# Patient Record
Sex: Female | Born: 1985 | Race: Black or African American | Hispanic: No | Marital: Single | Smoking: Never smoker
Health system: Southern US, Community
[De-identification: ages and names within clinical notes are randomized; demographics above are authoritative.]

## PROBLEM LIST (undated history)

## (undated) HISTORY — PX: CHOLECYSTECTOMY: SHX55

---
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
## 2007-05-24 IMAGING — US US OB COMP +14 WK
1 series · 14 of 21 positions shown · non-contrast
Comparison: none

CLINICAL DATA: Abdominal pain and fever.

[Series 1: us ob comp +14 wk · 0.35mm/px · 14 of 21 slices shown]
[im 1/21]
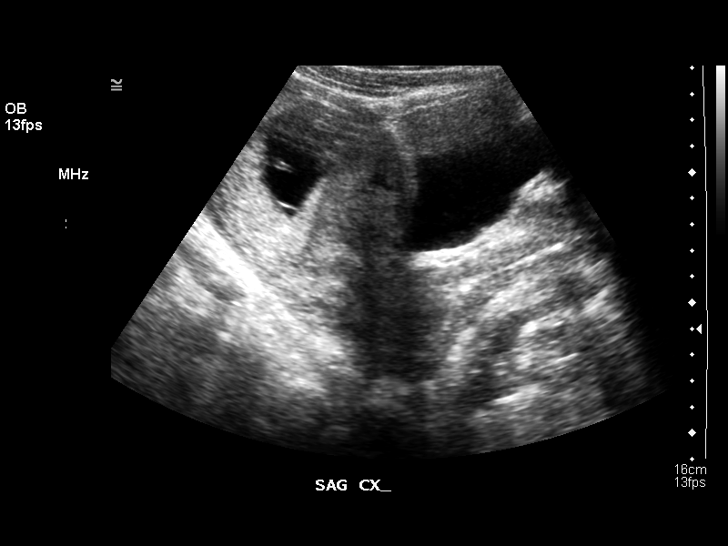
[im 3/21]
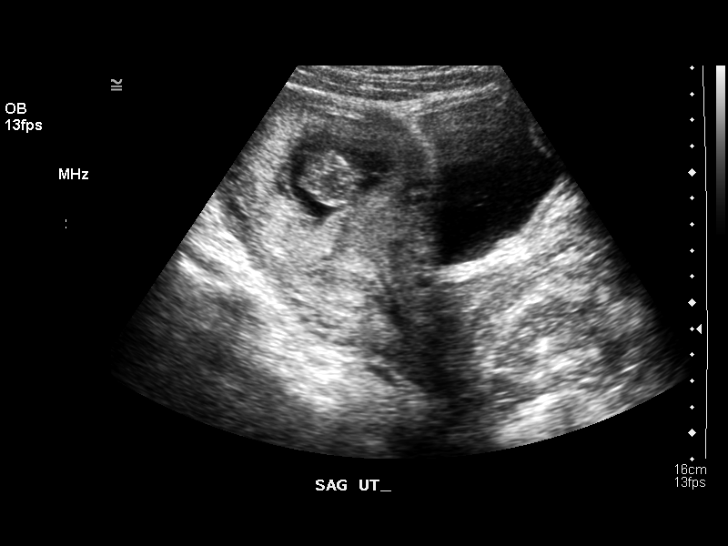
[im 4/21]
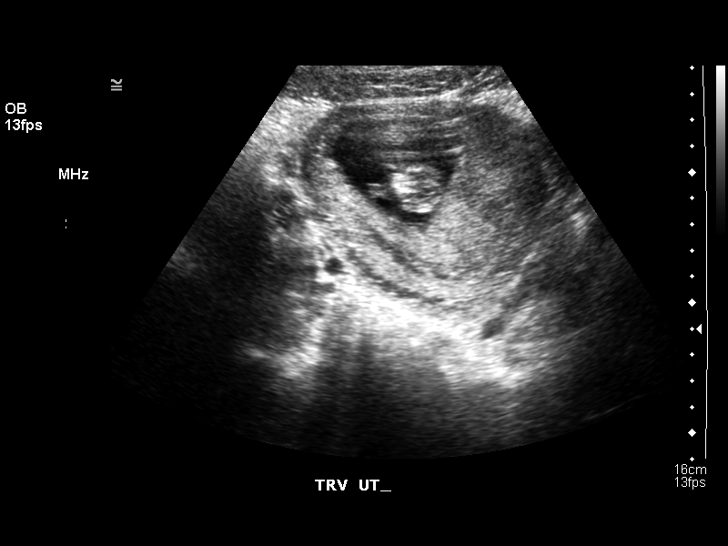
[im 6/21]
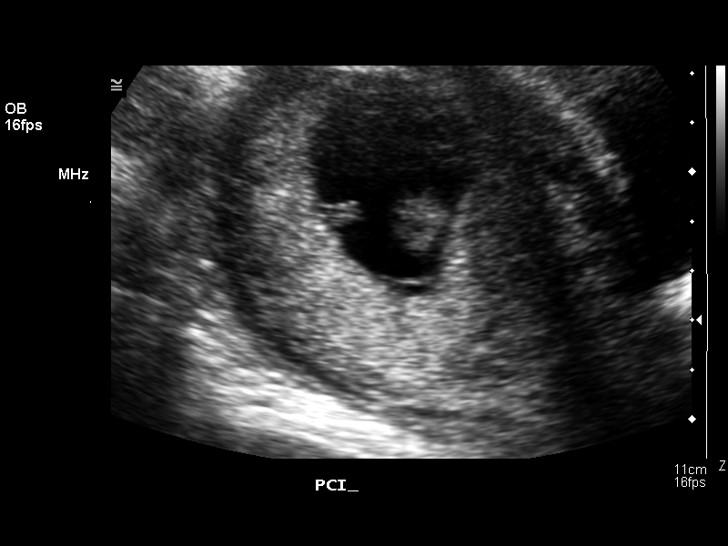
[im 7/21]
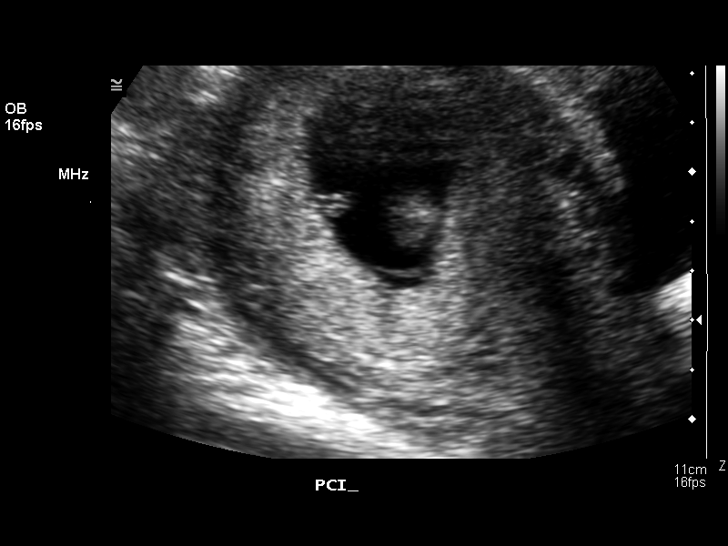
[im 9/21]
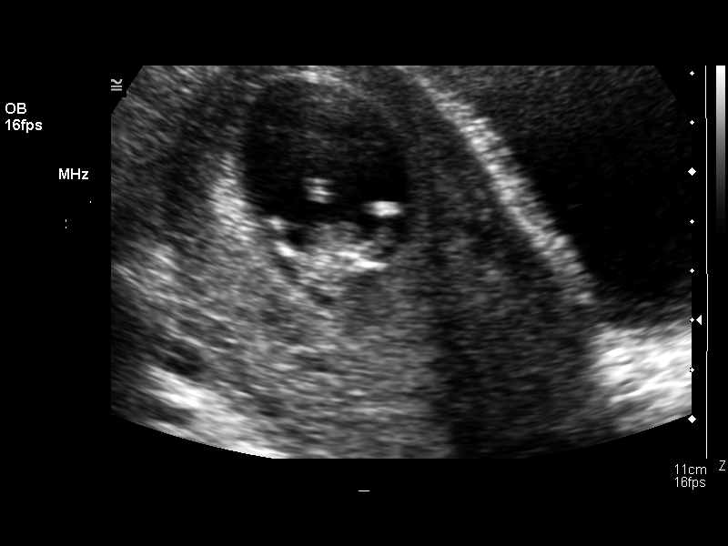
[im 10/21]
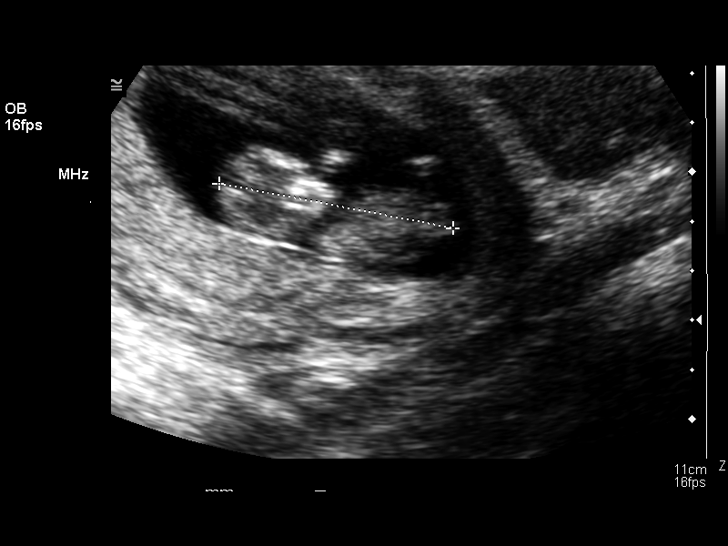
[im 12/21]
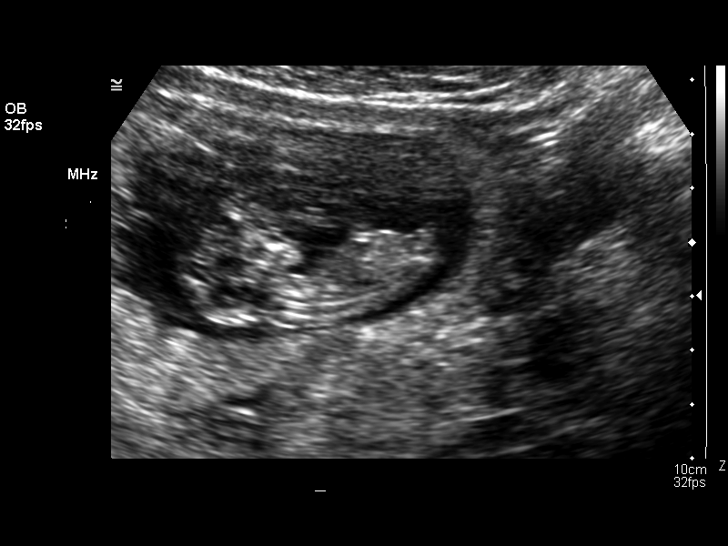
[im 13/21]
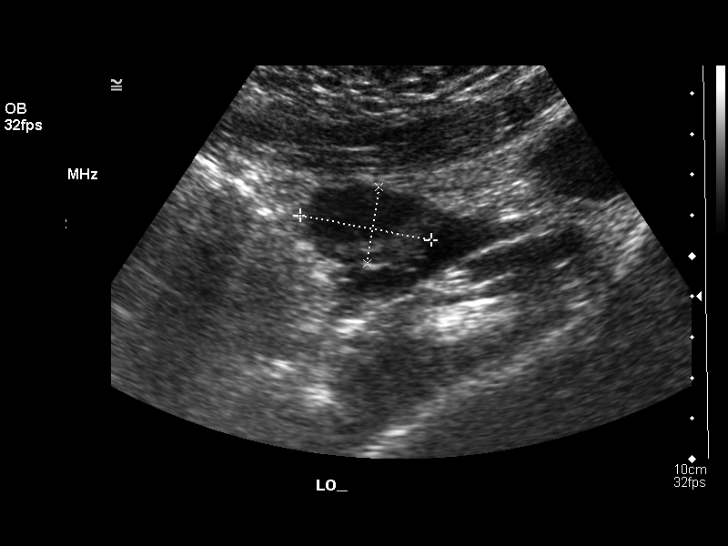
[im 15/21]
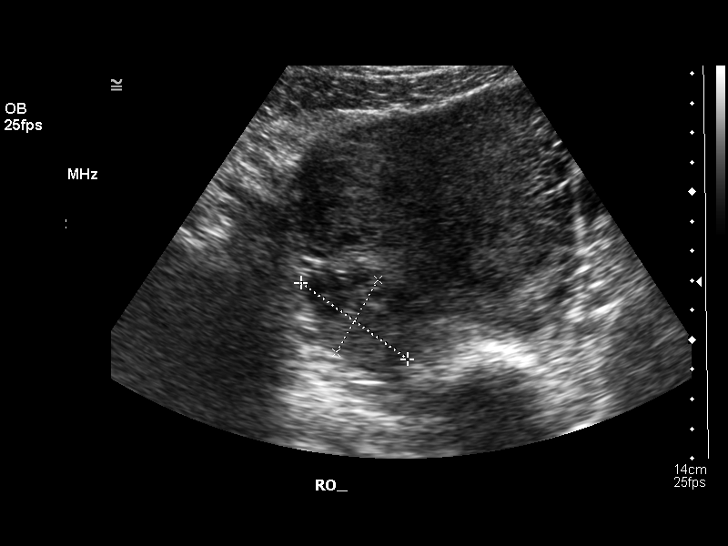
[im 16/21]
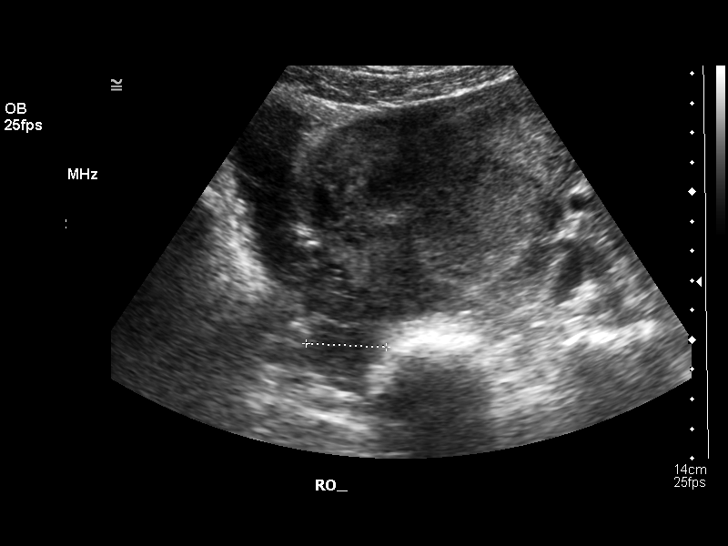
[im 18/21]
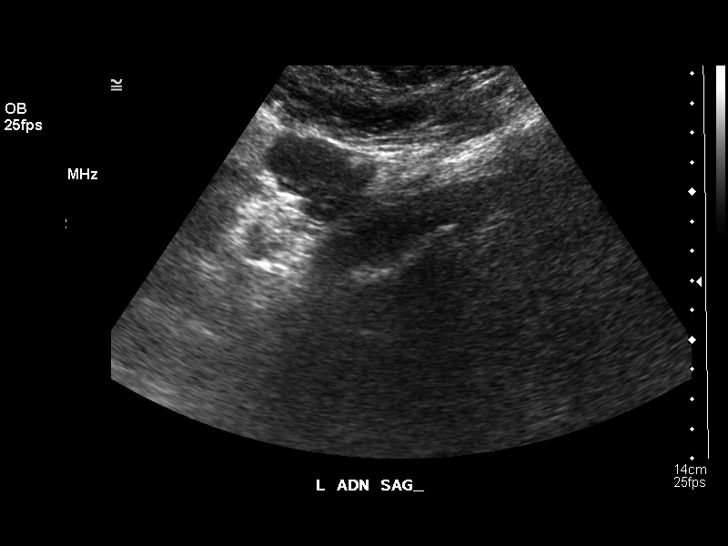
[im 19/21]
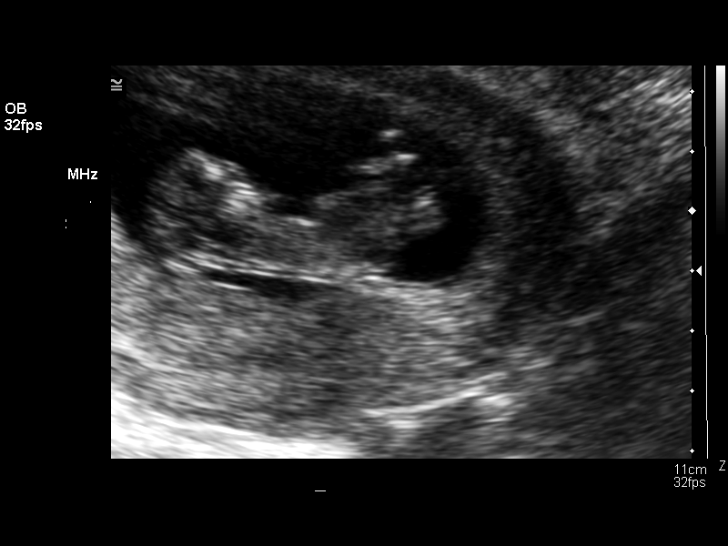
[im 21/21]
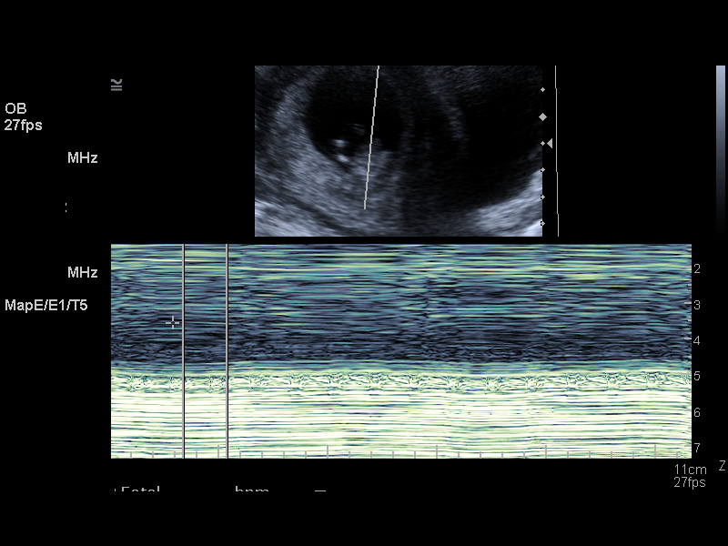

[14 of 21 positions shown; findings below may reference images not displayed]

OBSTETRICAL ULTRASOUND:
Number of Fetuses: 1
Heart Rate:  150
Movement:  Yes
Breathing:  No  
Presentation:  Variable
Amniotic Fluid (Subjective):  Normal
CRL:  4.8 cm   11 w 4 d
Ultrasound EDC:  06/03/06
Fetal anatomy was not evaluated on today?s exam due to early gestational age.  Amnion is visualized. 

MATERNAL UTERINE AND ADNEXAL FINDINGS
Cervix: Not evaluated < 14 wks.  Normal ovaries.
IMPRESSION: 1.  Single living intrauterine gestation at 11 weeks 4 day estimated gestational age based on crown rump length. This is approximately 4 ? weeks behind the reported gestational age by LMP suggesting that LMP dating may be incorrect.  
2.  Repeat sonographic assessment at 18 weeks is recommended for definitive characterization of fetal anatomy.

## 2014-01-25 ENCOUNTER — Encounter (HOSPITAL_COMMUNITY): Payer: Self-pay | Admitting: Emergency Medicine

## 2014-01-25 DIAGNOSIS — R109 Unspecified abdominal pain: Secondary | ICD-10-CM | POA: Diagnosis present

## 2014-01-25 DIAGNOSIS — Z3202 Encounter for pregnancy test, result negative: Secondary | ICD-10-CM | POA: Insufficient documentation

## 2014-01-25 DIAGNOSIS — E669 Obesity, unspecified: Secondary | ICD-10-CM | POA: Insufficient documentation

## 2014-01-25 DIAGNOSIS — N7013 Chronic salpingitis and oophoritis: Secondary | ICD-10-CM | POA: Diagnosis not present

## 2014-01-25 DIAGNOSIS — Z9089 Acquired absence of other organs: Secondary | ICD-10-CM | POA: Insufficient documentation

## 2014-01-25 LAB — URINALYSIS, ROUTINE W REFLEX MICROSCOPIC
BILIRUBIN URINE: NEGATIVE
Glucose, UA: NEGATIVE mg/dL
Ketones, ur: NEGATIVE mg/dL
Leukocytes, UA: NEGATIVE
Nitrite: NEGATIVE
Protein, ur: NEGATIVE mg/dL
Specific Gravity, Urine: 1.026 (ref 1.005–1.030)
UROBILINOGEN UA: 0.2 mg/dL (ref 0.0–1.0)
pH: 6.5 (ref 5.0–8.0)

## 2014-01-25 LAB — URINE MICROSCOPIC-ADD ON

## 2014-01-25 LAB — CBC
HEMATOCRIT: 35.9 % — AB (ref 36.0–46.0)
HEMOGLOBIN: 11.9 g/dL — AB (ref 12.0–15.0)
MCH: 28.1 pg (ref 26.0–34.0)
MCHC: 33.1 g/dL (ref 30.0–36.0)
MCV: 84.7 fL (ref 78.0–100.0)
Platelets: 201 10*3/uL (ref 150–400)
RBC: 4.24 MIL/uL (ref 3.87–5.11)
RDW: 12.8 % (ref 11.5–15.5)
WBC: 9.1 10*3/uL (ref 4.0–10.5)

## 2014-01-25 LAB — PREGNANCY, URINE: Preg Test, Ur: NEGATIVE

## 2014-01-25 NOTE — ED Notes (Addendum)
Pt reprots miscarriage 2 month ago and has had lower abdominal pain and vaginal bleeding since. [facility] and told she had an ovarian cyst but was not given follow up,. Denies abnormal discharge or pain with urination. Pt reports 3 abortions, 1 miscarriage and 4 living children,.

## 2014-01-26 ENCOUNTER — Emergency Department (HOSPITAL_COMMUNITY): Payer: Medicaid Other

## 2014-01-26 ENCOUNTER — Emergency Department (HOSPITAL_COMMUNITY)
Admission: EM | Admit: 2014-01-26 | Discharge: 2014-01-26 | Disposition: A | Discharge status: Home / Self Care | Payer: Medicaid Other | Attending: Emergency Medicine | Admitting: Emergency Medicine

## 2014-01-26 DIAGNOSIS — N7011 Chronic salpingitis: Secondary | ICD-10-CM

## 2014-01-26 DIAGNOSIS — R102 Pelvic and perineal pain: Secondary | ICD-10-CM

## 2014-01-26 LAB — RPR

## 2014-01-26 LAB — WET PREP, GENITAL
TRICH WET PREP: NONE SEEN
Yeast Wet Prep HPF POC: NONE SEEN

## 2014-01-26 LAB — HIV ANTIBODY (ROUTINE TESTING W REFLEX): HIV 1&2 Ab, 4th Generation: NONREACTIVE

## 2014-01-26 MED ORDER — HYDROCODONE-ACETAMINOPHEN 5-325 MG PO TABS: 1.0000 | ORAL_TABLET | ORAL | Status: AC | PRN

## 2014-01-26 MED ORDER — NAPROXEN 500 MG PO TABS: 500.0000 mg | ORAL_TABLET | Freq: Two times a day (BID) | ORAL | Status: AC

## 2014-01-26 MED ORDER — CEFTRIAXONE SODIUM 250 MG IJ SOLR
250.0000 mg | Freq: Once | INTRAMUSCULAR | Status: AC
  Administered 2014-01-26: 250 mg via INTRAMUSCULAR
  Filled 2014-01-26: qty 250

## 2014-01-26 MED ORDER — TRAMADOL HCL 50 MG PO TABS
50.0000 mg | ORAL_TABLET | Freq: Once | ORAL | Status: AC
  Administered 2014-01-26: 50 mg via ORAL
  Filled 2014-01-26: qty 1

## 2014-01-26 MED ORDER — LIDOCAINE HCL (PF) 1 % IJ SOLN
INTRAMUSCULAR | Status: AC
  Administered 2014-01-26: 0.9 mL
  Filled 2014-01-26: qty 5

## 2014-01-26 MED ORDER — AZITHROMYCIN 250 MG PO TABS
1000.0000 mg | ORAL_TABLET | Freq: Once | ORAL | Status: AC
  Administered 2014-01-26: 1000 mg via ORAL
  Filled 2014-01-26: qty 4

## 2014-01-26 MED ORDER — NAPROXEN 250 MG PO TABS
500.0000 mg | ORAL_TABLET | Freq: Once | ORAL | Status: AC
  Administered 2014-01-26: 500 mg via ORAL
  Filled 2014-01-26: qty 2

## 2014-01-26 MED ORDER — DOXYCYCLINE HYCLATE 100 MG PO CAPS: 100.0000 mg | ORAL_CAPSULE | Freq: Two times a day (BID) | ORAL | Status: AC

## 2014-01-26 NOTE — ED Notes (Signed)
Patient still off the unit for testing 

## 2014-01-26 NOTE — ED Notes (Signed)
Dr. Otter at the bedside.  

## 2014-01-26 NOTE — ED Notes (Signed)
Dr. Norlene Campbelltter is performing pelvic exam with Baxter HireKristen, CNA at the bedside for assist.

## 2014-01-26 NOTE — ED Notes (Signed)
Pt alert and oriented at discharge.  Pt verbalized understanding of discharge instructions.   

## 2014-01-26 NOTE — Discharge Instructions (Signed)
Your exam today showed some swelling around your right ovary/fallopian tube.  No acute signs of infection were noted, but you are being covered for possible infection with medications that were prescribed.  Please contact your gynecologist for re-evaluation in 1 week.  Return to the ER for fevers, vomiting, worsening pain, or other new concerning symptoms.  No sexual intercourse until cleared by GYN.   Pelvic Inflammatory Disease Pelvic inflammatory disease (PID) refers to an infection in some or all of the female organs. The infection can be in the uterus, ovaries, fallopian tubes, or the surrounding tissues in the pelvis. PID can cause abdominal or pelvic pain that comes on suddenly (acute pelvic pain). PID is a serious infection because it can lead to lasting (chronic) pelvic pain or the inability to have children (infertile).  CAUSES  The infection is often caused by the normal bacteria found in the vaginal tissues. PID may also be caused by an infection that is spread during sexual contact. PID can also occur following:   The birth of a baby.   A miscarriage.   An abortion.   Major pelvic surgery.   The use of an intrauterine device (IUD).   A sexual assault.  RISK FACTORS Certain factors can put a person at higher risk for PID, such as:  Being younger than 25 years.  Being sexually active at [place] age.  Usingnonbarrier contraception.  Havingmultiple sexual partners.  Having sex with someone who has symptoms of a genital infection.  Using oral contraception. Other times, certain behaviors can increase the possibility of getting PID, such as:  Having sex during your period.  Using a vaginal douche.  Having an intrauterine device (IUD) in place. SYMPTOMS   Abdominal or pelvic pain.   Fever.   Chills.   Abnormal vaginal discharge.  Abnormal uterine bleeding.   Unusual pain shortly after finishing your period. DIAGNOSIS  Your caregiver will choose  some of the following methods to make a diagnosis, such as:   Performinga physical exam and history. A pelvic exam typically reveals a very tender uterus and surrounding pelvis.   Ordering laboratory tests including a pregnancy test, blood tests, and urine test.  Orderingcultures of the vagina and cervix to check for a sexually transmitted infection (STI).  Performing an ultrasound.   Performing a laparoscopic procedure to look inside the pelvis.  TREATMENT   Antibiotic medicines may be prescribed and taken by mouth.   Sexual partners may be treated when the infection is caused by a sexually transmitted disease (STD).   Hospitalization may be needed to give antibiotics intravenously.  Surgery may be needed, but this is rare. It may take weeks until you are completely well. If you are diagnosed with PID, you should also be checked for human immunodeficiency virus (HIV). HOME CARE INSTRUCTIONS   If given, take your antibiotics as directed. Finish the medicine even if you start to feel better.   Only take over-the-counter or prescription medicines for pain, discomfort, or fever as directed by your caregiver.   Do not have sexual intercourse until treatment is completed or as directed by your caregiver. If PID is confirmed, your recent sexual partner(s) will need treatment.   Keep your follow-up appointments. SEEK MEDICAL CARE IF:   You have increased or abnormal vaginal discharge.   You need prescription medicine for your pain.   You vomit.   You cannot take your medicines.   Your partner has an STD.  SEEK IMMEDIATE MEDICAL CARE IF:  You have a fever.   You have increased abdominal or pelvic pain.   You have chills.   You have pain when you urinate.   You are not better after 72 hours following treatment.  MAKE SURE YOU:   Understand these instructions.  Will watch your condition.  Will get help right away if you are not doing well or get  worse. Document Released: 05/28/2005 Document Revised: 09/22/2012 Document Reviewed: 05/24/2011 [facility] Patient Information 2015 [place], [place]. This information is not intended to replace advice given to you by your health care provider. Make sure you discuss any questions you have with your health care provider.   Pelvic Pain Female pelvic pain can be caused by many different things and start from a variety of places. Pelvic pain refers to pain that is located in the lower half of the abdomen and between your hips. The pain may occur over a short period of time (acute) or may be reoccurring (chronic). The cause of pelvic pain may be related to disorders affecting the female reproductive organs (gynecologic), but it may also be related to the bladder, kidney stones, an intestinal complication, or muscle or skeletal problems. Getting help right away for pelvic pain is important, especially if there has been severe, sharp, or a sudden onset of unusual pain. It is also important to get help right away because some types of pelvic pain can be life threatening.  CAUSES  Below are only some of the causes of pelvic pain. The causes of pelvic pain can be in one of several categories.   Gynecologic.  Pelvic inflammatory disease.  Sexually transmitted infection.  Ovarian cyst or a twisted ovarian ligament (ovarian torsion).  Uterine lining that grows outside the uterus (endometriosis).  Fibroids, cysts, or tumors.  Ovulation.  Pregnancy.  Pregnancy that occurs outside the uterus (ectopic pregnancy).  Miscarriage.  Labor.  Abruption of the placenta or ruptured uterus.  Infection.  Uterine infection (endometritis).  Bladder infection.  Diverticulitis.  Miscarriage related to a uterine infection (septic abortion).  Bladder.  Inflammation of the bladder (cystitis).  Kidney stone(s).  Gastrointestinal.  Constipation.  Diverticulitis.  Neurologic.  Trauma.  Feeling  pelvic pain because of mental or emotional causes (psychosomatic).  Cancers of the bowel or pelvis. EVALUATION  Your caregiver will want to take a careful history of your concerns. This includes recent changes in your health, a careful gynecologic history of your periods (menses), and a sexual history. Obtaining your family history and medical history is also important. Your caregiver may suggest a pelvic exam. A pelvic exam will help identify the location and severity of the pain. It also helps in the evaluation of which organ system may be involved. In order to identify the cause of the pelvic pain and be properly treated, your caregiver may order tests. These tests may include:   A pregnancy test.  Pelvic ultrasonography.  An X-ray exam of the abdomen.  A urinalysis or evaluation of vaginal discharge.  Blood tests. HOME CARE INSTRUCTIONS   Only take over-the-counter or prescription medicines for pain, discomfort, or fever as directed by your caregiver.   Rest as directed by your caregiver.   Eat a balanced diet.   Drink enough fluids to make your urine clear or pale yellow, or as directed.   Avoid sexual intercourse if it causes pain.   Apply warm or cold compresses to the lower abdomen depending on which one helps the pain.   Avoid stressful situations.   Keep  a journal of your pelvic pain. Write down when it started, where the pain is located, and if there are things that seem to be associated with the pain, such as food or your menstrual cycle.  Follow up with your caregiver as directed.  SEEK MEDICAL CARE IF:  Your medicine does not help your pain.  You have abnormal vaginal discharge. SEEK IMMEDIATE MEDICAL CARE IF:   You have heavy bleeding from the vagina.   Your pelvic pain increases.   You feel light-headed or faint.   You have chills.   You have pain with urination or blood in your urine.   You have uncontrolled diarrhea or vomiting.    You have a fever or persistent symptoms for more than 3 days.  You have a fever and your symptoms suddenly get worse.   You are being physically or sexually abused.  MAKE SURE YOU:  Understand these instructions.  Will watch your condition.  Will get help if you are not doing well or get worse. Document Released: 04/24/2004 Document Revised: 10/12/2013 Document Reviewed: 09/17/2011 [facility] Patient Information 2015 [place], [place]. This information is not intended to replace advice given to you by your health care provider. Make sure you discuss any questions you have with your health care provider.

## 2014-01-26 NOTE — ED Provider Notes (Signed)
CSN: [id number]     Arrival date & time 01/25/14  2155 History   First MD Initiated Contact with Patient 01/26/14 0037     Chief Complaint  Patient presents with  . Abdominal Pain     (Consider location/radiation/quality/duration/timing/severity/associated sxs/prior Treatment) HPI 28 year old female, G9 P4054 presents to the emergency department with complaint of persistent right-sided abdominal pain ongoing for the last 8-9 months.  Patient reports over the last month the pain has gotten worse.  She notices pain during intercourse and walking.  Patient reports she had a miscarriage diagnosed at the [facility] department 2 months ago.  She had a miscarriage in December followed by the [facility] clinic.  She had an ultrasound at that time that showed physiologic left-sided ovarian cysts, but no explanation for her right-sided pain.  She has not taken any Tylenol or ibuprofen for the pain.  She denies any dysuria.  She denies any vaginal discharge.  Patient plan to followup with her GYN clinic, the pain worsened today and she was unable to make it to term. History reviewed. No pertinent past medical history. Past Surgical History  Procedure Laterality Date  . Cholecystectomy     No family history on file. History  Substance Use Topics  . Smoking status: Never Smoker   . Smokeless tobacco: Not on file  . Alcohol Use: No   OB History   Grav Para Term Preterm Abortions TAB SAB Ect Mult Living                 Review of Systems   See History of Present Illness; otherwise all other systems are reviewed and negative  Allergies  Review of patient's allergies indicates no known allergies.  Home Medications   Prior to Admission medications   Not on File   BP 124/77  Pulse 65  Temp(Src) 98.3 F (36.8 C) (Oral)  Resp 16  Ht 5\' 3"  (1.6 m)  Wt 228 lb (103.42 kg)  BMI 40.40 kg/m2  SpO2 100%  LMP 10/30/2013 Physical Exam  Nursing note and vitals reviewed. Constitutional: She is oriented  to person, place, and time. She appears well-developed and well-nourished.  Obese female, no acute distress  HENT:  Head: Normocephalic and atraumatic.  Left Ear: External ear normal.  Nose: Nose normal.  Mouth/Throat: Oropharynx is clear and moist.  Eyes: Conjunctivae and EOM are normal. Pupils are equal, round, and reactive to light.  Neck: Normal range of motion. Neck supple. No JVD present. No tracheal deviation present. No thyromegaly present.  Cardiovascular: Normal rate, regular rhythm, normal heart sounds and intact distal pulses.  Exam reveals no gallop and no friction rub.   No murmur heard. Pulmonary/Chest: Effort normal and breath sounds normal. No stridor. No respiratory distress. She has no wheezes. She has no rales. She exhibits no tenderness.  Abdominal: Soft. Bowel sounds are normal. She exhibits no distension and no mass. There is no tenderness. There is no rebound and no guarding.  Genitourinary:  External genitalia normal Vagina with thin watery discharge Cervix closed no lesions Moderate cervical motion tenderness Adnexa palpated, no masses or tenderness noted on left.  Patient with tenderness in right adnexa, no masses noted Bladder palpated no tenderness Uterus palpated no masses or tenderness    Musculoskeletal: Normal range of motion. She exhibits no edema and no tenderness.  Lymphadenopathy:    She has no cervical adenopathy.  Neurological: She is alert and oriented to person, place, and time. She exhibits normal muscle tone. Coordination  normal.  Skin: Skin is warm and dry. No rash noted. No erythema. No pallor.  Psychiatric: She has a normal mood and affect. Her behavior is normal. Judgment and thought content normal.    ED Course  Procedures (including critical care time) Labs Review Labs Reviewed  WET PREP, GENITAL - Abnormal; Notable for the following:    Clue Cells Wet Prep HPF POC FEW (*)    WBC, Wet Prep HPF POC FEW (*)    All other components  within normal limits  CBC - Abnormal; Notable for the following:    Hemoglobin 11.9 (*)    HCT 35.9 (*)    All other components within normal limits  URINALYSIS, ROUTINE W REFLEX MICROSCOPIC - Abnormal; Notable for the following:    Hgb urine dipstick TRACE (*)    All other components within normal limits  URINE MICROSCOPIC-ADD ON - Abnormal; Notable for the following:    Bacteria, UA FEW (*)    All other components within normal limits  GC/CHLAMYDIA PROBE AMP  PREGNANCY, URINE  RPR  HIV ANTIBODY (ROUTINE TESTING)    Imaging Review US Transvaginal Non-ob  01/26/2014   CLINICAL DATA:  right adnexal pain for several months, worse for last few weeks, +cervical motion tenderness and vaginal discharge  EXAM: TRANSVAGINAL ULTRASOUND OF PELVIS; US PELVIS COMPLETE  TECHNIQUE: Transvaginal ultrasound examination of the pelvis was performed including evaluation of the uterus, ovaries, adnexal regions, and pelvic cul-de-sac.  COMPARISON:  Prior ultrasound from 11/16/2005  FINDINGS: Uterus  Measurements: 7.3 x 4.4 x 5.2 cm. No fibroids or other mass visualized.  Endometrium  Thickness: 5.5 mm.  No focal abnormality visualized.  Right ovary  Measurements: 4.2 x 2.4 x 2.1 cm. The right ovary demonstrated a normal appearance with normal echogenicity and normal follicles. A 2.4 x 2.1 x 1.9 cm anechoic cystic lesion seen adjacent to the right ovary, which may reflect a paraovarian cyst or possibly hydrosalpinx as this lesion appears somewhat tubular on several images. No internal vascularity or soft tissue component.  Left ovary  Measurements: 3.4 x 2.0 x 2.3 cm. The left ovary demonstrated a normal appearance with normal echogenicity. Heterogeneous hypoechoic lesion measuring 1.8 x 1.5 x 1.8 cm within the left ovary is most suggestive of a hemorrhagic cyst.  Other findings:  Trace free fluid present within the right adnexa.  IMPRESSION: 1. No sonographic evidence of ovarian torsion. 2. 2.4 x 0 2.1 x 1.9 cm  anechoic cyst adjacent to the right ovary. Finding may reflect a benign parovarian cyst versus hydrosalpinx. No internal debris or associated inflammatory changes to suggest infection. 3. 1.8 x 1.5 x 1.8 cm probable hemorrhagic left ovarian cyst. 4. Normal sonographic appearance of the uterus.   Electronically Signed   By: Rise Mu M.D.   On: 01/26/2014 03:15   US Pelvis Complete  01/26/2014   CLINICAL DATA:  right adnexal pain for several months, worse for last few weeks, +cervical motion tenderness and vaginal discharge  EXAM: TRANSVAGINAL ULTRASOUND OF PELVIS; US PELVIS COMPLETE  TECHNIQUE: Transvaginal ultrasound examination of the pelvis was performed including evaluation of the uterus, ovaries, adnexal regions, and pelvic cul-de-sac.  COMPARISON:  Prior ultrasound from 11/16/2005  FINDINGS: Uterus  Measurements: 7.3 x 4.4 x 5.2 cm. No fibroids or other mass visualized.  Endometrium  Thickness: 5.5 mm.  No focal abnormality visualized.  Right ovary  Measurements: 4.2 x 2.4 x 2.1 cm. The right ovary demonstrated a normal appearance with normal echogenicity and normal follicles.  A 2.4 x 2.1 x 1.9 cm anechoic cystic lesion seen adjacent to the right ovary, which may reflect a paraovarian cyst or possibly hydrosalpinx as this lesion appears somewhat tubular on several images. No internal vascularity or soft tissue component.  Left ovary  Measurements: 3.4 x 2.0 x 2.3 cm. The left ovary demonstrated a normal appearance with normal echogenicity. Heterogeneous hypoechoic lesion measuring 1.8 x 1.5 x 1.8 cm within the left ovary is most suggestive of a hemorrhagic cyst.  Other findings:  Trace free fluid present within the right adnexa.  IMPRESSION: 1. No sonographic evidence of ovarian torsion. 2. 2.4 x 0 2.1 x 1.9 cm anechoic cyst adjacent to the right ovary. Finding may reflect a benign parovarian cyst versus hydrosalpinx. No internal debris or associated inflammatory changes to suggest infection. 3.  1.8 x 1.5 x 1.8 cm probable hemorrhagic left ovarian cyst. 4. Normal sonographic appearance of the uterus.   Electronically Signed   By: Rise MuBenjamin  McClintock M.D.   On: 01/26/2014 03:15     EKG Interpretation None      MDM   Final diagnoses:  Pelvic pain in female  Hydrosalpinx   28 year old female with several months of right pelvic pain worse with intercourse and now with walking.  Pelvic exam shows moderate cervical motion tenderness and right adnexal tenderness.  Concern for PID.  We'll get ultrasound looking for ovarian cyst or tubo-ovarian abscess.  Patient nontoxic appearing.  Will treat pain, plan for antibiotics most likely.  Ultrasound with hydrosalpinx versus benign parovarian cyst.  Per radiology, no signs of inflammatory changes.  Given cervical motion tenderness, however plan to treat with Rocephin and Cipro with follow on Doxy.  Patient informed of results and need for close followup with her gynecologist.    Olivia Mackielga M Persia Lintner, MD 01/26/14 [phone]

## 2014-01-27 LAB — GC/CHLAMYDIA PROBE AMP
CT PROBE, AMP APTIMA: NEGATIVE
GC Probe RNA: NEGATIVE

## 2015-08-03 IMAGING — US US PELVIS COMPLETE
1 series · 13 of 25 positions shown · non-contrast
Comparison: Prior ultrasound from 11/16/2005

CLINICAL DATA: right adnexal pain for several months, worse for
last few weeks, +cervical motion tenderness and vaginal discharge

EXAM:
TRANSVAGINAL ULTRASOUND OF PELVIS; US PELVIS COMPLETE
TECHNIQUE: Transvaginal ultrasound examination of the pelvis was performed
including evaluation of the uterus, ovaries, adnexal regions, and
pelvic cul-de-sac.

[Series 1: us pelvis complete · 0.25mm/px · 13 of 57 slices shown]
[im 1/57]
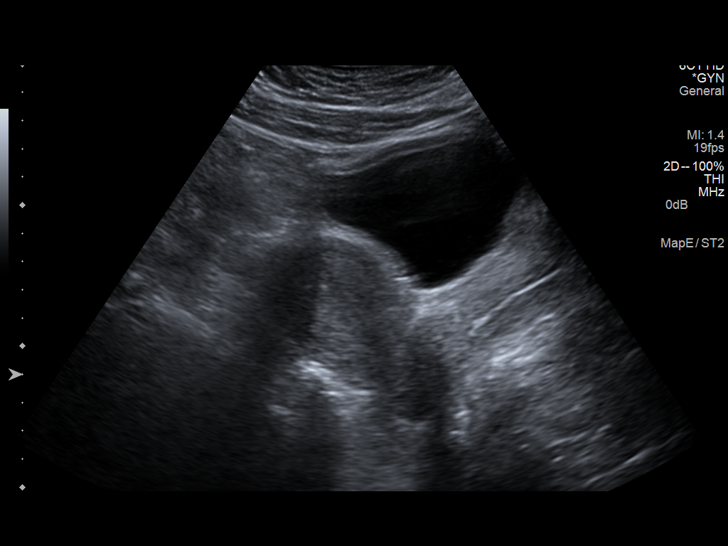
[im 5/57]
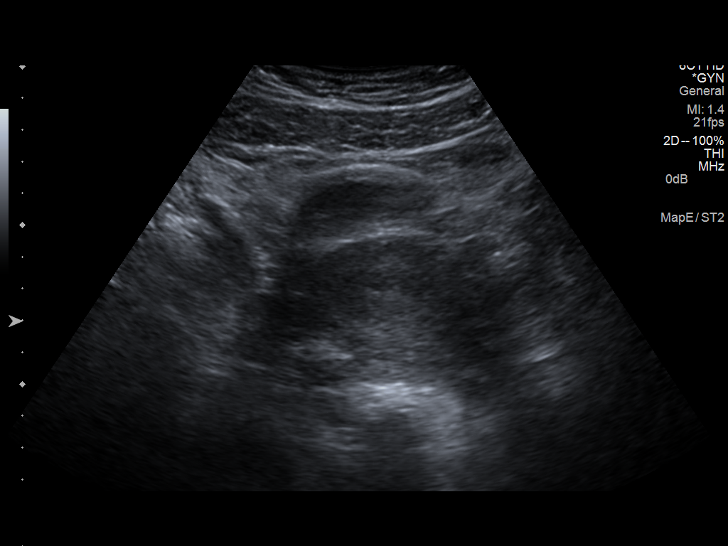
[im 10/57]
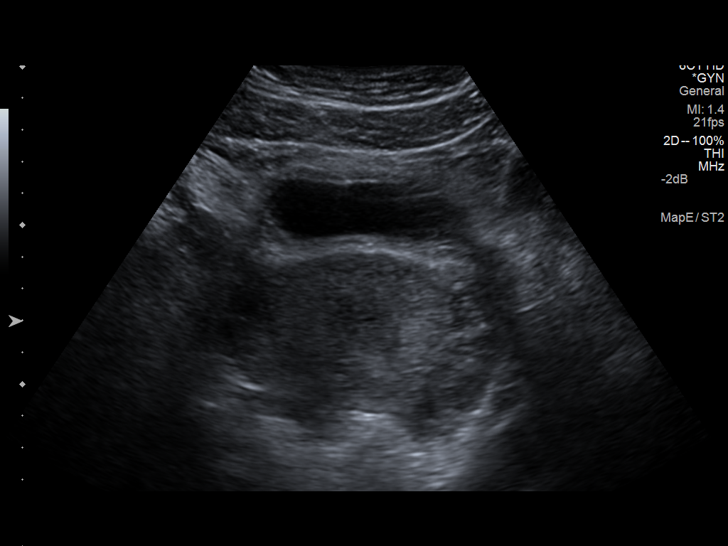
[im 15/57]
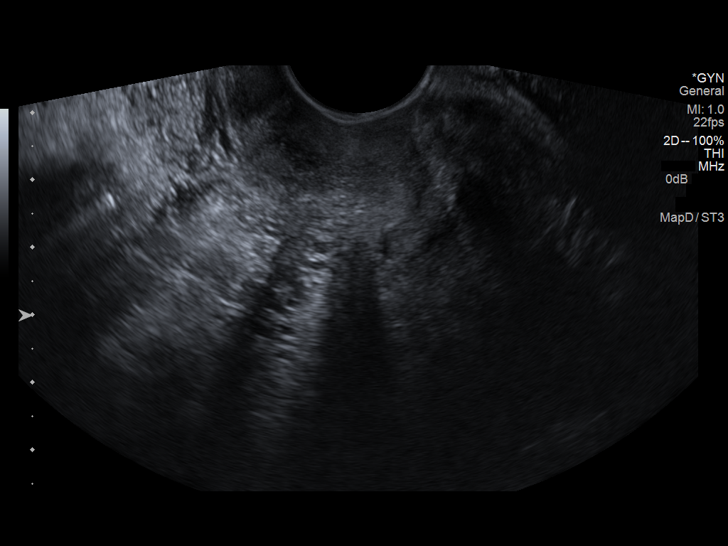
[im 19/57]
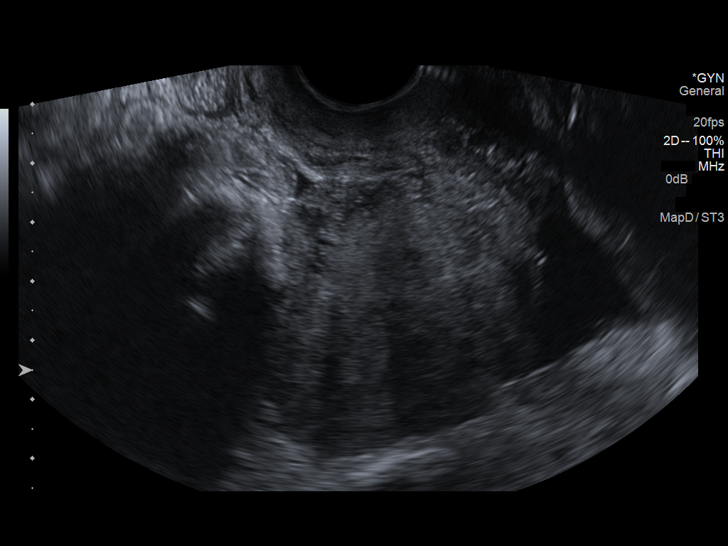
[im 24/57]
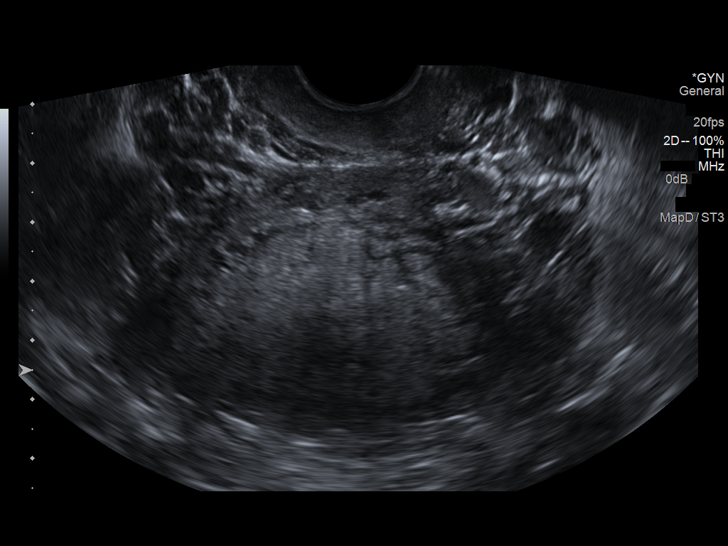
[im 29/57]
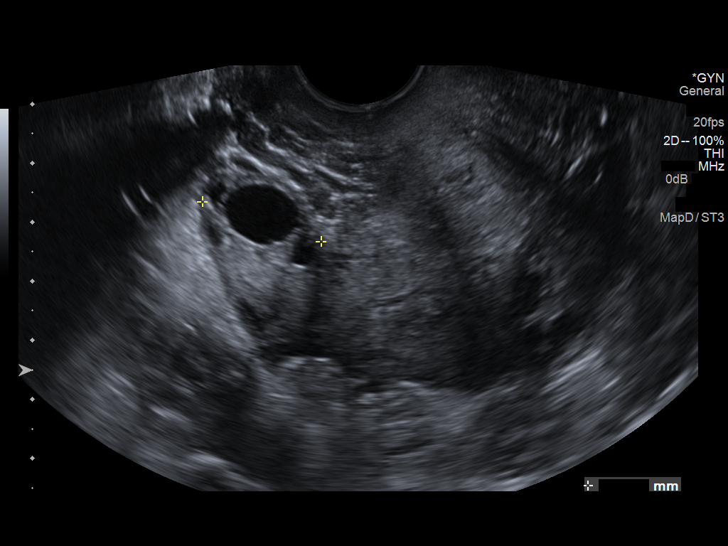
[im 33/57]
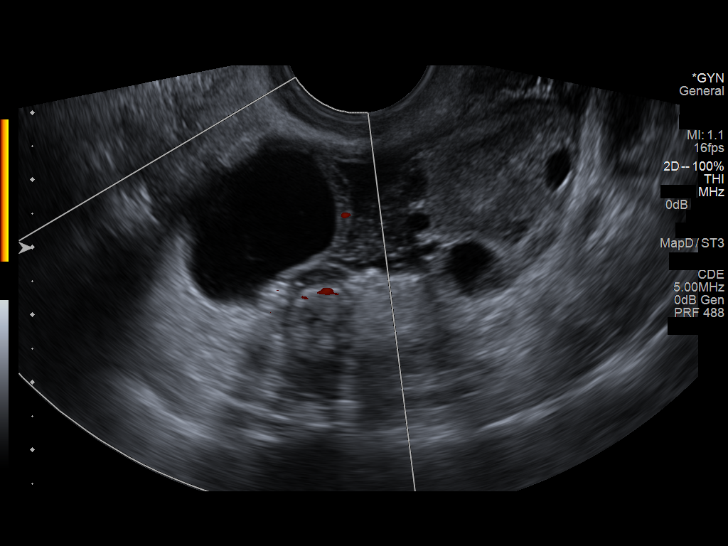
[im 38/57]
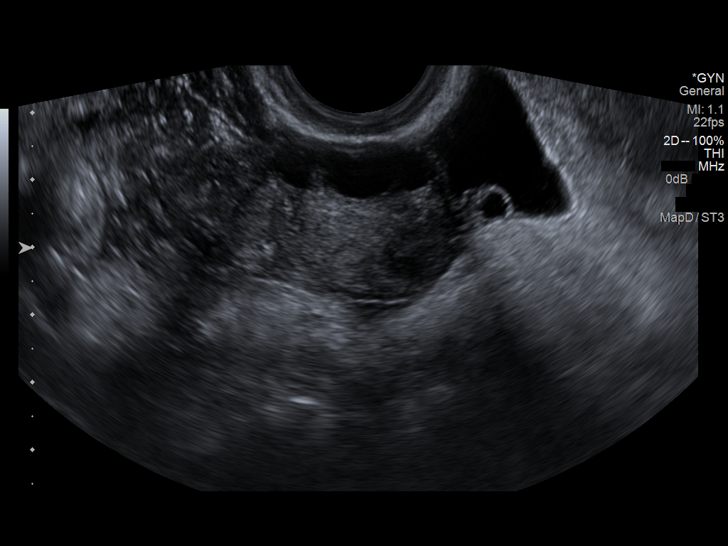
[im 43/57]
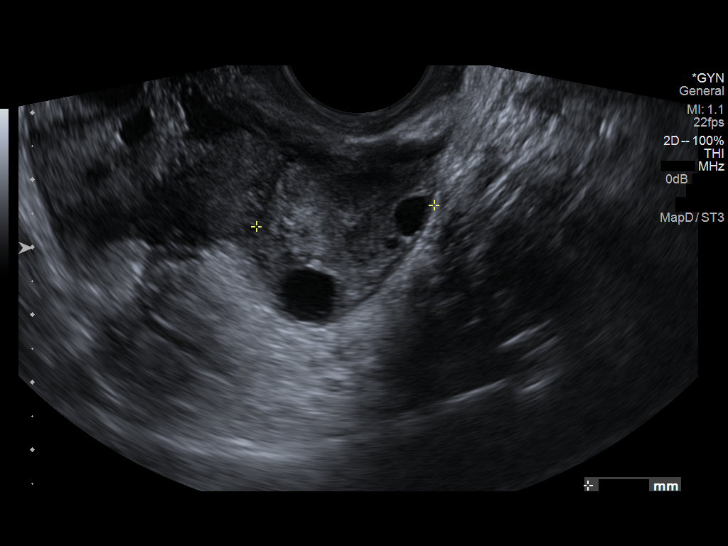
[im 47/57]
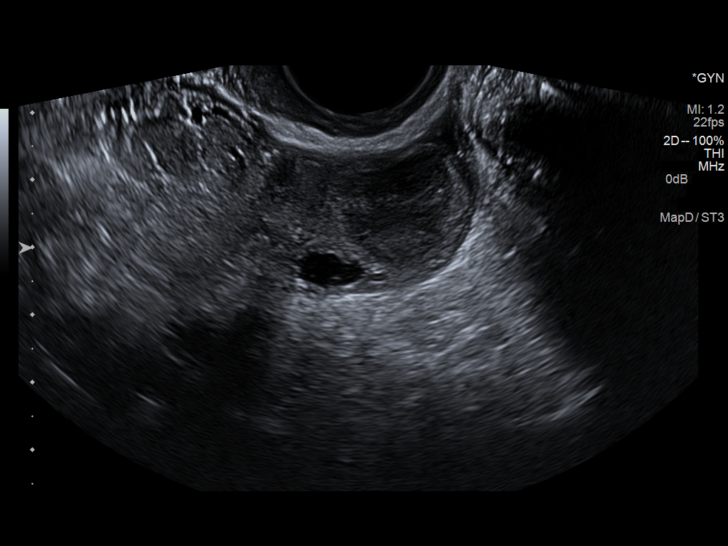
[im 52/57]
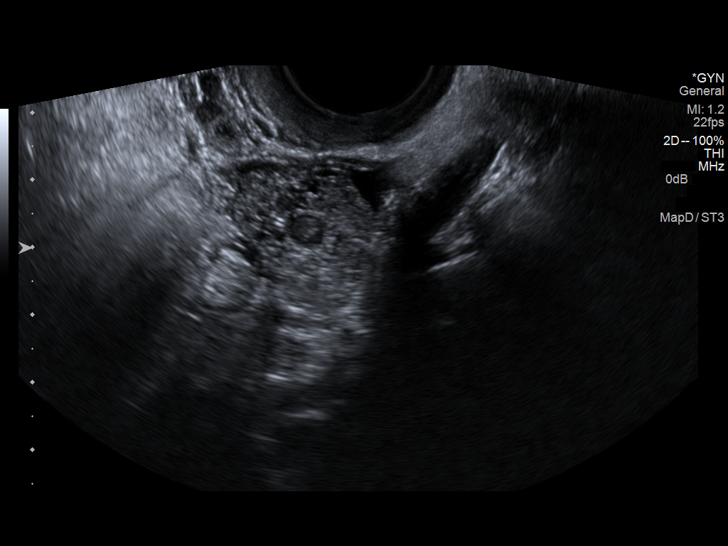
[im 57/57]
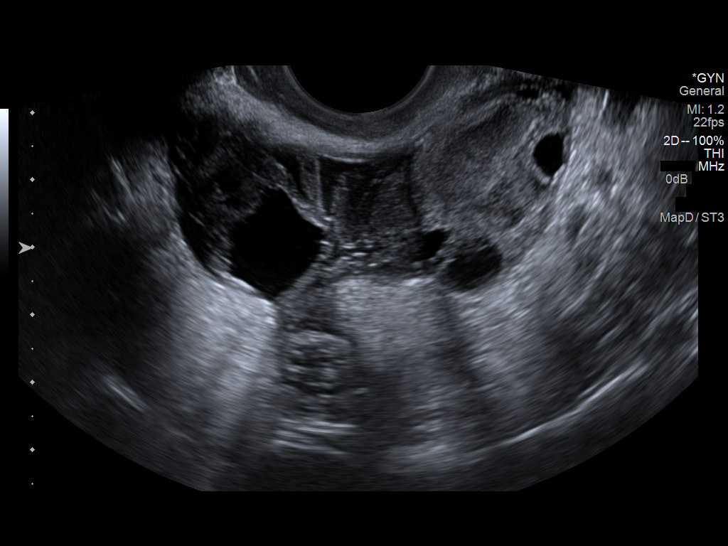

[13 of 25 positions shown; findings below may reference images not displayed]

FINDINGS: Uterus

Measurements: 7.3 x 4.4 x 5.2 cm. No fibroids or other mass
visualized.

Endometrium

Thickness: 5.5 mm.  No focal abnormality visualized.

Right ovary

Measurements: 4.2 x 2.4 x 2.1 cm. The right ovary demonstrated a
normal appearance with normal echogenicity and normal follicles. A
2.4 x 2.1 x 1.9 cm anechoic cystic lesion seen adjacent to the right
ovary, which may reflect a paraovarian cyst or possibly hydrosalpinx
as this lesion appears somewhat tubular on several images. No
internal vascularity or soft tissue component.

Left ovary

Measurements: 3.4 x 2.0 x 2.3 cm. The left ovary demonstrated a
normal appearance with normal echogenicity. Heterogeneous hypoechoic
lesion measuring 1.8 x 1.5 x 1.8 cm within the left ovary is most
suggestive of a hemorrhagic cyst.

Other findings:  Trace free fluid present within the right adnexa.
IMPRESSION: 1. No sonographic evidence of ovarian torsion.
2. 2.4 x 0 2.1 x 1.9 cm anechoic cyst adjacent to the right ovary.
Finding may reflect a benign parovarian cyst versus hydrosalpinx. No
internal debris or associated inflammatory changes to suggest
infection.
3. 1.8 x 1.5 x 1.8 cm probable hemorrhagic left ovarian cyst.
4. Normal sonographic appearance of the uterus.

## 2022-03-25 ENCOUNTER — Emergency Department (HOSPITAL_COMMUNITY)
Admission: EM | Admit: 2022-03-25 | Discharge: 2022-03-25 | Disposition: A | Discharge status: Home / Self Care | Payer: Medicaid Other | Attending: Emergency Medicine | Admitting: Emergency Medicine

## 2022-03-25 ENCOUNTER — Emergency Department (HOSPITAL_COMMUNITY): Payer: Medicaid Other

## 2022-03-25 ENCOUNTER — Other Ambulatory Visit: Payer: Self-pay

## 2022-03-25 ENCOUNTER — Encounter (HOSPITAL_COMMUNITY): Payer: Self-pay | Admitting: Emergency Medicine

## 2022-03-25 DIAGNOSIS — R519 Headache, unspecified: Secondary | ICD-10-CM | POA: Insufficient documentation

## 2022-03-25 DIAGNOSIS — R1011 Right upper quadrant pain: Secondary | ICD-10-CM | POA: Insufficient documentation

## 2022-03-25 DIAGNOSIS — R0789 Other chest pain: Secondary | ICD-10-CM | POA: Diagnosis not present

## 2022-03-25 DIAGNOSIS — R079 Chest pain, unspecified: Secondary | ICD-10-CM

## 2022-03-25 DIAGNOSIS — H538 Other visual disturbances: Secondary | ICD-10-CM | POA: Diagnosis not present

## 2022-03-25 LAB — BASIC METABOLIC PANEL
Anion gap: 10 (ref 5–15)
BUN: 14 mg/dL (ref 6–20)
CO2: 24 mmol/L (ref 22–32)
Calcium: 9.4 mg/dL (ref 8.9–10.3)
Chloride: 105 mmol/L (ref 98–111)
Creatinine, Ser: 0.88 mg/dL (ref 0.44–1.00)
GFR, Estimated: >60 mL/min (ref >60)
Glucose, Bld: 105 mg/dL — ABNORMAL HIGH (ref 70–99)
Potassium: 3.5 mmol/L (ref 3.5–5.1)
Sodium: 139 mmol/L (ref 135–145)

## 2022-03-25 LAB — CBC
HCT: 40.7 % (ref 36.0–46.0)
Hemoglobin: 13.6 g/dL (ref 12.0–15.0)
MCH: 28.6 pg (ref 26.0–34.0)
MCHC: 33.4 g/dL (ref 30.0–36.0)
MCV: 85.5 fL (ref 80.0–100.0)
Platelets: 221 10*3/uL (ref 150–400)
RBC: 4.76 MIL/uL (ref 3.87–5.11)
RDW: 13.2 % (ref 11.5–15.5)
WBC: 9.4 10*3/uL (ref 4.0–10.5)
nRBC: 0 % (ref 0.0–0.2)

## 2022-03-25 LAB — I-STAT BETA HCG BLOOD, ED (MC, WL, AP ONLY): I-stat hCG, quantitative: <5 m[IU]/mL (ref <5)

## 2022-03-25 LAB — TROPONIN I (HIGH SENSITIVITY)
Troponin I (High Sensitivity): <2 ng/L (ref <18)
Troponin I (High Sensitivity): 3 ng/L (ref <18)

## 2022-03-25 MED ORDER — OXYCODONE-ACETAMINOPHEN 5-325 MG PO TABS
2.0000 | ORAL_TABLET | Freq: Once | ORAL | Status: AC
  Administered 2022-03-25: 2 via ORAL
  Filled 2022-03-25: qty 2

## 2022-03-25 MED ORDER — AMLODIPINE BESYLATE 5 MG PO TABS: 5.0000 mg | ORAL_TABLET | Freq: Every day | ORAL | 0 refills | Status: AC

## 2022-03-25 NOTE — ED Provider Notes (Signed)
[facility] EMERGENCY DEPARTMENT Provider Note   CSN: [id number] Arrival date & time: 03/25/22  0830     History  Chief Complaint  Patient presents with   Chest Pain    Stacey Noble is a 36 y.o. female.  HPI 36 year old female G10 P7 A3, currently not pregnant, with youngest age 10 months history of preeclampsia, presents today complaining of ongoing headache and right-sided chest pain that she describes a pressure that started 3 days ago.  She also points to her right upper quadrant.  She denies any cough, fever, sore throat, dyspnea, shortness of breath.  Per RN she complained of some blurring of vision.  She did not complain of this to me.     Home Medications Prior to Admission medications   Medication Sig Start Date End Date Taking? Authorizing Provider  amLODipine (NORVASC) 5 MG tablet Take 1 tablet (5 mg total) by mouth daily. 03/25/22  Yes Pattricia Boss, MD  doxycycline (VIBRAMYCIN) 100 MG capsule Take 1 capsule (100 mg total) by mouth 2 (two) times daily. 01/26/14   Linton Flemings, MD  HYDROcodone-acetaminophen (NORCO/VICODIN) 5-325 MG per tablet Take 1-2 tablets by mouth every 4 (four) hours as needed for moderate pain or severe pain. 01/26/14   Linton Flemings, MD  naproxen (NAPROSYN) 500 MG tablet Take 1 tablet (500 mg total) by mouth 2 (two) times daily. 01/26/14   Linton Flemings, MD      Allergies    Patient has no known allergies.    Review of Systems   Review of Systems  Physical Exam Updated Vital Signs BP (!) 146/88 (BP Location: Left Arm)   Pulse 66   Temp 98.4 F (36.9 C)   Resp 17   SpO2 100%  Physical Exam Vitals and nursing note reviewed.  Constitutional:      General: She is not in acute distress.    Appearance: She is well-developed. She is obese.  HENT:     Head: Normocephalic and atraumatic.  Eyes:     Extraocular Movements: Extraocular movements intact.     Pupils: Pupils are equal, round, and reactive to light.   Cardiovascular:     Rate and Rhythm: Normal rate and regular rhythm.     Heart sounds: Normal heart sounds.  Pulmonary:     Effort: Pulmonary effort is normal.     Breath sounds: Normal breath sounds.  Abdominal:     General: Bowel sounds are normal. There is no abdominal bruit.     Palpations: Abdomen is soft.  Musculoskeletal:        General: Normal range of motion.     Cervical back: Normal range of motion and neck supple.     Right lower leg: No tenderness. No edema.     Left lower leg: No tenderness. No edema.  Skin:    General: Skin is warm and dry.     Capillary Refill: Capillary refill takes less than 2 seconds.  Neurological:     General: No focal deficit present.     Mental Status: She is alert.     ED Results / Procedures / Treatments   Labs (all labs ordered are listed, but only abnormal results are displayed) Labs Reviewed  BASIC METABOLIC PANEL - Abnormal; Notable for the following components:      Result Value   Glucose, Bld 105 (*)    All other components within normal limits  CBC  I-STAT BETA HCG BLOOD, ED ([facility], [facility], [facility] ONLY)  TROPONIN I (HIGH SENSITIVITY)  TROPONIN I (HIGH SENSITIVITY)    EKG EKG Interpretation  Date/Time:  Sunday March 25 2022 08:36:40 EDT Ventricular Rate:  77 PR Interval:  152 QRS Duration: 62 QT Interval:  384 QTC Calculation: 434 R Axis:   57 Text Interpretation: Normal sinus rhythm Non-specific ST-t changes No old tracing to compare Confirmed by Rigby Swamy ([id number]) on 03/25/2022 1:36:46 PM  Radiology DG Chest 2 View  Result Date: 03/25/2022 CLINICAL DATA:  Chest pain EXAM: CHEST - 2 VIEW COMPARISON:  None Available. FINDINGS: The heart size and mediastinal contours are within normal limits. No focal airspace consolidation, pleural effusion, or pneumothorax. The visualized skeletal structures are unremarkable. IMPRESSION: No active cardiopulmonary disease. Electronically Signed   By: Nicholas  Plundo D.O.   On:  03/25/2022 09:07    Procedures Procedures    Medications Ordered in ED Medications  oxyCODONE-acetaminophen (PERCOCET/ROXICET) 5-325 MG per tablet 2 tablet (2 tablets Oral Given 03/25/22 1418)    ED Course/ Medical Decision Making/ A&P                           Medical Decision Making 36 year old female history of preeclampsia, currently not pregnant x7 months presents today with nonspecific headache and right-sided chest pain.  Patient was evaluated with chest x-Labria Wos, EKG, labs, prior to my evaluation Here in the ED her blood pressure has been slightly elevated at 146/87 Differential diagnosis includes but is not limited to acute coronary syndrome, PE, pneumothorax, infection, dissection of great vessels Patient seen and evaluated for chest pain.  Differential diagnosis of serious/life threatening causes of chest pain includes ACS, other diseases of the heart such as myocarditis or pericarditis, lung etiologies such as infection or pneumothorax, diseases of the great vessels such as aortic dissection or AAA, pulmonary embolism, or GI sources such as cholecystitis or other upper abdominal causes. Doubt ACS- heart score documented, EKG reviewed, Given the timing of pain to ER presentation, delta troponin normal so doubt NSTEMI troponin and repeat troponin obtained and WNL Doubt myocarditis/pericarditis/tamponade based on history, review of ekg and labs Doubt aortic dissection based on history and review of imaging Doubt intrinsic lung causes such as pneumonia or pneumothorax, based on history, physical exam, and studies obtained. Doubt PE based on history, physical exam, and PERC Doubt acute GI etiology requiring intervention based on history, physical exam and labs. Patient appears stable for discharge. Return precautions and need for follow up discussed and patient voices understanding Plan to begin amlodipine and patient advised to follow up with her pmd   Amount and/or Complexity of  Data Reviewed Labs: ordered. Decision-making details documented in ED Course. Radiology: ordered and independent interpretation performed. Decision-making details documented in ED Course. ECG/medicine tests: ordered and independent interpretation performed. Decision-making details documented in ED Course.  Risk Prescription drug management.           Final Clinical Impression(s) / ED Diagnoses Final diagnoses:  Nonspecific chest pain    Rx / DC Orders ED Discharge Orders          Ordered    amLODipine (NORVASC) 5 MG tablet  Daily        10 /15/23 1353              10-15-1969, MD 03/25/22 1517

## 2022-03-25 NOTE — ED Triage Notes (Signed)
Patient here with compliant of a constant headache that started approximately seven months ago after the birth her son, history of pre-eclampsia, and intermittent right-sided chest pain that started three days ago. Patient denies shortness of breath, denies nausea, reports blurred vision that started with the headache. Patient is alert, oriented, and in no apparent distress at this time.

## 2022-03-25 NOTE — Discharge Instructions (Addendum)
There is no evidence of blood clot, heart attack or other severe etiology of chest pain. Your blood pressure is elevated at 146/87.  You are being started back on amlodipine.  Please take as prescribed. Recheck with your doctor this week Return if any worsening of symptoms
